# Patient Record
Sex: Male | Born: 2000 | Race: White | Hispanic: No | Marital: Single | Smoking: Never smoker
Health system: Southern US, Community
[De-identification: ages and names within clinical notes are randomized; demographics above are authoritative.]

## PROBLEM LIST (undated history)

## (undated) DIAGNOSIS — S060X9A Concussion with loss of consciousness of unspecified duration, initial encounter: Secondary | ICD-10-CM

## (undated) DIAGNOSIS — S060XAA Concussion with loss of consciousness status unknown, initial encounter: Secondary | ICD-10-CM

## (undated) HISTORY — PX: WISDOM TOOTH EXTRACTION: SHX21

---

## 2019-07-08 ENCOUNTER — Other Ambulatory Visit: Payer: Self-pay

## 2019-07-08 DIAGNOSIS — Z20822 Contact with and (suspected) exposure to covid-19: Secondary | ICD-10-CM

## 2019-07-10 LAB — NOVEL CORONAVIRUS, NAA: SARS-CoV-2, NAA: NOT DETECTED

## 2019-08-03 ENCOUNTER — Other Ambulatory Visit: Payer: Self-pay

## 2019-08-03 DIAGNOSIS — Z20822 Contact with and (suspected) exposure to covid-19: Secondary | ICD-10-CM

## 2019-08-05 LAB — NOVEL CORONAVIRUS, NAA: SARS-CoV-2, NAA: NOT DETECTED

## 2020-09-16 ENCOUNTER — Encounter: Payer: Self-pay | Admitting: Emergency Medicine

## 2020-09-16 ENCOUNTER — Emergency Department: Payer: Commercial Managed Care - PPO

## 2020-09-16 ENCOUNTER — Emergency Department
Admission: EM | Admit: 2020-09-16 | Discharge: 2020-09-16 | Disposition: A | Discharge status: Home / Self Care | Payer: Commercial Managed Care - PPO | Attending: Emergency Medicine | Admitting: Emergency Medicine

## 2020-09-16 ENCOUNTER — Other Ambulatory Visit: Payer: Self-pay

## 2020-09-16 DIAGNOSIS — S0993XA Unspecified injury of face, initial encounter: Secondary | ICD-10-CM | POA: Diagnosis present

## 2020-09-16 DIAGNOSIS — W01198A Fall on same level from slipping, tripping and stumbling with subsequent striking against other object, initial encounter: Secondary | ICD-10-CM | POA: Diagnosis not present

## 2020-09-16 DIAGNOSIS — S0181XA Laceration without foreign body of other part of head, initial encounter: Secondary | ICD-10-CM

## 2020-09-16 DIAGNOSIS — S0232XA Fracture of orbital floor, left side, initial encounter for closed fracture: Secondary | ICD-10-CM | POA: Diagnosis not present

## 2020-09-16 DIAGNOSIS — Z23 Encounter for immunization: Secondary | ICD-10-CM | POA: Diagnosis not present

## 2020-09-16 DIAGNOSIS — S0285XA Fracture of orbit, unspecified, initial encounter for closed fracture: Secondary | ICD-10-CM

## 2020-09-16 HISTORY — DX: Concussion with loss of consciousness of unspecified duration, initial encounter: S06.0X9A

## 2020-09-16 HISTORY — DX: Concussion with loss of consciousness status unknown, initial encounter: S06.0XAA

## 2020-09-16 MED ORDER — TETANUS-DIPHTH-ACELL PERTUSSIS 5-2.5-18.5 LF-MCG/0.5 IM SUSY
0.5000 mL | PREFILLED_SYRINGE | Freq: Once | INTRAMUSCULAR | Status: AC
  Administered 2020-09-16: 0.5 mL via INTRAMUSCULAR
  Filled 2020-09-16: qty 0.5

## 2020-09-16 MED ORDER — LIDOCAINE-EPINEPHRINE 2 %-1:100000 IJ SOLN
20.0000 mL | Freq: Once | INTRAMUSCULAR | Status: AC
  Administered 2020-09-16: 20 mL via INTRADERMAL
  Filled 2020-09-16: qty 1

## 2020-09-16 MED ORDER — BACITRACIN ZINC 500 UNIT/GM EX OINT
TOPICAL_OINTMENT | Freq: Two times a day (BID) | CUTANEOUS | Status: DC
  Filled 2020-09-16: qty 0.9

## 2020-09-16 MED ORDER — IBUPROFEN 600 MG PO TABS: 600.0000 mg | ORAL_TABLET | Freq: Three times a day (TID) | ORAL | 0 refills | Status: DC | PRN

## 2020-09-16 MED ORDER — TETRACAINE HCL 0.5 % OP SOLN
2.0000 [drp] | Freq: Once | OPHTHALMIC | Status: AC
  Administered 2020-09-16: 2 [drp] via OPHTHALMIC
  Filled 2020-09-16: qty 4

## 2020-09-16 MED ORDER — BACITRACIN ZINC 500 UNIT/GM EX OINT: TOPICAL_OINTMENT | Freq: Two times a day (BID) | CUTANEOUS | Status: DC

## 2020-09-16 MED ORDER — CEPHALEXIN 500 MG PO CAPS: 500.0000 mg | ORAL_CAPSULE | Freq: Three times a day (TID) | ORAL | 0 refills | Status: AC

## 2020-09-16 NOTE — ED Provider Notes (Signed)
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 extraocular movements without clinical evidence of entrapment.  Will have him follow-up with ophthalmologist but no apparent emergent indication indicated.  No nose blowing.  Soft diet encouraged.  ____________________________________________  FINAL CLINICAL IMPRESSION(S) / ED DIAGNOSES  Final diagnoses:  Facial laceration, initial encounter  Closed fracture of orbit, initial encounter (HCC)     MEDICATIONS GIVEN DURING THIS VISIT:  Medications  bacitracin ointment (has no administration in time range)  Tdap (BOOSTRIX) injection 0.5 mL (0.5 mLs Intramuscular Given 09/16/20 1859)  tetracaine (PONTOCAINE) 0.5 % ophthalmic solution 2 drop (2 drops Left Eye Given by Other 09/16/20 1832)  lidocaine-EPINEPHrine (XYLOCAINE W/EPI) 2 %-1:100000 (with pres) injection 20 mL (20 mLs Intradermal Given by Other 09/16/20 1832)     ED Discharge Orders         Ordered    cephALEXin (KEFLEX) 500 MG capsule  3 times daily        09/16/20 1850    ibuprofen (ADVIL) 600 MG tablet  Every 8 hours PRN        09/16/20 1850           Note:  This document was prepared using Dragon voice recognition software and may include  unintentional dictation errors.   Shaune Pollack, MD 09/16/20 [phone]

## 2020-09-16 NOTE — Discharge Instructions (Addendum)
Cover the wound in antibiotic ointment (over-the-counter is fine) twice to three times a day until healed The sutures should dissolve on their own  For your fracture: NO NOSE BLOWING or direct pressure to your eye Apply ice intermittently for the next 24 hours No swimming or submerging your head underwater Follow-up with an eye doctor (opto or ophthalmologist) or ENT physician in the next week  Try eating a soft diet for 1-2 days
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
## 2020-11-27 ENCOUNTER — Other Ambulatory Visit: Payer: Self-pay

## 2020-11-27 ENCOUNTER — Emergency Department: Payer: Commercial Managed Care - PPO

## 2020-11-27 ENCOUNTER — Emergency Department
Admission: EM | Admit: 2020-11-27 | Discharge: 2020-11-27 | Disposition: A | Discharge status: Home / Self Care | Payer: Commercial Managed Care - PPO | Attending: Emergency Medicine | Admitting: Emergency Medicine

## 2020-11-27 DIAGNOSIS — W01198A Fall on same level from slipping, tripping and stumbling with subsequent striking against other object, initial encounter: Secondary | ICD-10-CM | POA: Diagnosis not present

## 2020-11-27 DIAGNOSIS — F10220 Alcohol dependence with intoxication, uncomplicated: Secondary | ICD-10-CM | POA: Insufficient documentation

## 2020-11-27 DIAGNOSIS — S0990XA Unspecified injury of head, initial encounter: Secondary | ICD-10-CM

## 2020-11-27 DIAGNOSIS — Y92511 Restaurant or cafe as the place of occurrence of the external cause: Secondary | ICD-10-CM | POA: Diagnosis not present

## 2020-11-27 DIAGNOSIS — F10920 Alcohol use, unspecified with intoxication, uncomplicated: Secondary | ICD-10-CM

## 2020-11-27 DIAGNOSIS — S0101XA Laceration without foreign body of scalp, initial encounter: Secondary | ICD-10-CM | POA: Diagnosis not present

## 2020-11-27 DIAGNOSIS — Y908 Blood alcohol level of 240 mg/100 ml or more: Secondary | ICD-10-CM | POA: Diagnosis not present

## 2020-11-27 LAB — CBC WITH DIFFERENTIAL/PLATELET
Abs Immature Granulocytes: 0.02 10*3/uL (ref 0.00–0.07)
Basophils Absolute: 0.1 10*3/uL (ref 0.0–0.1)
Basophils Relative: 1 %
Eosinophils Absolute: 0.2 10*3/uL (ref 0.0–0.5)
Eosinophils Relative: 3 %
HCT: 43.5 % (ref 39.0–52.0)
Hemoglobin: 15 g/dL (ref 13.0–17.0)
Immature Granulocytes: 0 %
Lymphocytes Relative: 34 %
Lymphs Abs: 2.3 10*3/uL (ref 0.7–4.0)
MCH: 30.9 pg (ref 26.0–34.0)
MCHC: 34.5 g/dL (ref 30.0–36.0)
MCV: 89.7 fL (ref 80.0–100.0)
Monocytes Absolute: 0.4 10*3/uL (ref 0.1–1.0)
Monocytes Relative: 5 %
Neutro Abs: 4 10*3/uL (ref 1.7–7.7)
Neutrophils Relative %: 57 %
Platelets: 231 10*3/uL (ref 150–400)
RBC: 4.85 MIL/uL (ref 4.22–5.81)
RDW: 11.9 % (ref 11.5–15.5)
WBC: 6.9 10*3/uL (ref 4.0–10.5)
nRBC: 0 % (ref 0.0–0.2)

## 2020-11-27 LAB — COMPREHENSIVE METABOLIC PANEL
ALT: 21 U/L (ref 0–44)
AST: 36 U/L (ref 15–41)
Albumin: 4.8 g/dL (ref 3.5–5.0)
Alkaline Phosphatase: 56 U/L (ref 38–126)
Anion gap: 12 (ref 5–15)
BUN: 12 mg/dL (ref 6–20)
CO2: 24 mmol/L (ref 22–32)
Calcium: 8.8 mg/dL — ABNORMAL LOW (ref 8.9–10.3)
Chloride: 108 mmol/L (ref 98–111)
Creatinine, Ser: 1.22 mg/dL (ref 0.61–1.24)
GFR, Estimated: >60 mL/min (ref >60)
Glucose, Bld: 103 mg/dL — ABNORMAL HIGH (ref 70–99)
Potassium: 3.8 mmol/L (ref 3.5–5.1)
Sodium: 144 mmol/L (ref 135–145)
Total Bilirubin: 0.5 mg/dL (ref 0.3–1.2)
Total Protein: 7.7 g/dL (ref 6.5–8.1)

## 2020-11-27 LAB — ETHANOL: Alcohol, Ethyl (B): 312 mg/dL (ref <10)

## 2020-11-27 MED ORDER — SODIUM CHLORIDE 0.9 % IV BOLUS
1000.0000 mL | Freq: Once | INTRAVENOUS | Status: AC
  Administered 2020-11-27: 1000 mL via INTRAVENOUS

## 2020-11-27 NOTE — ED Notes (Signed)
Date and time results received: 11/27/20 0247 (use smartphrase ".now" to insert current time)  Test: ETOH Critical Value: 355  Name of Provider Notified: Dolores Frame, MD
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
## 2020-11-27 NOTE — ED Provider Notes (Incomplete)
[facility] Emergency Department Provider Note   ____________________________________________   Event Date/Time   First MD Initiated Contact with Patient 11/27/20 0130     (approximate)  I have reviewed the triage vital signs and the nursing notes.   HISTORY  Chief Complaint Fall and Head Injury    HPI Ethan Hardy is a 20 y.o. male ***        {**SYMPTOM/COMPLAINT  LOCATION (describe anatomically) DURATION (when did it start) TIMING (onset and pattern) SEVERITY (0-10, mild/moderate/severe) QUALITY (description of symptoms) CONTEXT (recent surgery, new meds, activity, etc.) MODIFYINGFACTORS (what makes it better/worse) ASSOCIATEDSYMPTOMS (pertinent positives and negatives)**} Past Medical History:  Diagnosis Date  . Concussion     There are no problems to display for this patient.   Past Surgical History:  Procedure Laterality Date  . WISDOM TOOTH EXTRACTION      Prior to Admission medications   Medication Sig Start Date End Date Taking? Authorizing Provider  ibuprofen (ADVIL) 600 MG tablet Take 1 tablet (600 mg total) by mouth every 8 (eight) hours as needed for moderate pain. 09/16/20   Shaune Pollack, MD    Allergies Patient has no known allergies.  No family history on file.  Social History Social History   Tobacco Use  . Smoking status: Never Smoker  . Smokeless tobacco: Never Used  Substance Use Topics  . Alcohol use: Yes  . Drug use: Yes    Types: Marijuana    Review of Systems {** Revise as appropriate then delete this line - Documentation of 10 systems is required  **} Constitutional: No fever/chills Eyes: No visual changes. ENT: No sore throat. Cardiovascular: Denies chest pain. Respiratory: Denies shortness of breath. Gastrointestinal: No abdominal pain.  No nausea, no vomiting.  No diarrhea.  No constipation. Genitourinary: Negative for dysuria. Musculoskeletal: Negative for back pain. Skin:  Negative for rash. Neurological: Negative for headaches, focal weakness or numbness. {**Psychiatric:  Endocrine:  Hematological/Lymphatic:  Allergic/Immunilogical: **}  ____________________________________________   PHYSICAL EXAM:  VITAL SIGNS: ED Triage Vitals  Enc Vitals Group     BP      Pulse      Resp      Temp      Temp src      SpO2      Weight      Height      Head Circumference      Peak Flow      Pain Score      Pain Loc      Pain Edu?      Excl. in GC?    {** Revise as appropriate then delete this line - 8 systems required **} Constitutional: Alert and oriented. Well appearing and in no acute distress. Eyes: Conjunctivae are normal. PERRL. EOMI. Head: Atraumatic. Nose: No congestion/rhinnorhea. Mouth/Throat: Mucous membranes are moist.  Oropharynx non-erythematous. Neck: No stridor.  {**No cervical spine tenderness to palpation.**} {**Hematological/Lymphatic/Immunilogical: No cervical lymphadenopathy. **}Cardiovascular: Normal rate, regular rhythm. Grossly normal heart sounds.  Good peripheral circulation. Respiratory: Normal respiratory effort.  No retractions. Lungs CTAB. Gastrointestinal: Soft and nontender. No distention. No abdominal bruits. No CVA tenderness. {**Genitourinary:  **}Musculoskeletal: No lower extremity tenderness nor edema.  No joint effusions. Neurologic:  Normal speech and language. No gross focal neurologic deficits are appreciated. No gait instability. Skin:  Skin is warm, dry and intact. No rash noted. Psychiatric: Mood and affect are normal. Speech and behavior are normal.  ____________________________________________   LABS (all labs ordered are  listed, but only abnormal results are displayed)  Labs Reviewed  CBC WITH DIFFERENTIAL/PLATELET  COMPREHENSIVE METABOLIC PANEL  ETHANOL   ____________________________________________  EKG  *** ____________________________________________  RADIOLOGY I, Dijuan Sleeth J,  personally viewed and evaluated these images (plain radiographs) as part of my medical decision making, as well as reviewing the written report by the radiologist.  ED MD interpretation:  ***  Official radiology report(s): No results found.  ____________________________________________   PROCEDURES  Procedure(s) performed (including Critical Care):  Procedures   ____________________________________________   INITIAL IMPRESSION / ASSESSMENT AND PLAN / ED COURSE  As part of my medical decision making, I reviewed the following data within the electronic MEDICAL RECORD NUMBER {[id number]::"Notes from prior ED visits","[place] Controlled Substance Database"}        ***      ____________________________________________   FINAL CLINICAL IMPRESSION(S) / ED DIAGNOSES  Final diagnoses:  None     ED Discharge Orders    None      *Please note:  [name] was evaluated in Emergency Department on 11/27/2020 for the symptoms described in the history of present illness. He was evaluated in the context of the global COVID-19 pandemic, which necessitated consideration that the patient might be at risk for infection with the SARS-CoV-2 virus that causes COVID-19. Institutional protocols and algorithms that pertain to the evaluation of patients at risk for COVID-19 are in a state of rapid change based on information released by regulatory bodies including the CDC and federal and state organizations. These policies and algorithms were followed during the patient's care in the ED.  Some ED evaluations and interventions may be delayed as a result of limited staffing during and the pandemic.*   Note:  This document was prepared using Dragon voice recognition software and may include unintentional dictation errors.

## 2020-11-27 NOTE — Discharge Instructions (Addendum)
Staple removal in 7 to 10 days.  You may take Tylenol as needed for pain.  Return to the ER for worsening symptoms, persistent vomiting, lethargy or other concerns.
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
## 2021-07-30 IMAGING — CT CT HEAD W/O CM
2 of 3 series · 12 of 46 positions shown, 15 images · non-contrast
Comparison: None.

CLINICAL DATA: Facial injury, does not recall, previously
intoxicated, woke with abrasions and swelling to the face, contact
lens possibly within the left eye

EXAM:
CT HEAD WITHOUT CONTRAST
CT MAXILLOFACIAL WITHOUT CONTRAST
CT CERVICAL SPINE WITHOUT CONTRAST
TECHNIQUE: Multidetector CT imaging of the head, cervical spine, and
maxillofacial structures were performed using the standard protocol
without intravenous contrast. Multiplanar CT image reconstructions
of the cervical spine and maxillofacial structures were also
generated.

[Series 2: head wo · axial · 0.42mm/px · z∈[-168,-48]mm · 9 of 29 slices shown, 12 images]
[im 3/29  brain]
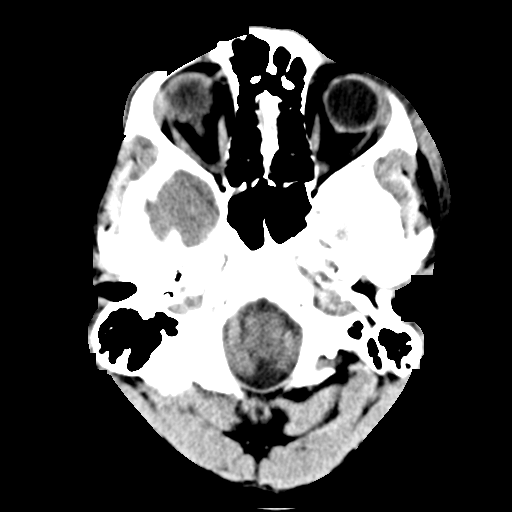
[im 3/29  bone]
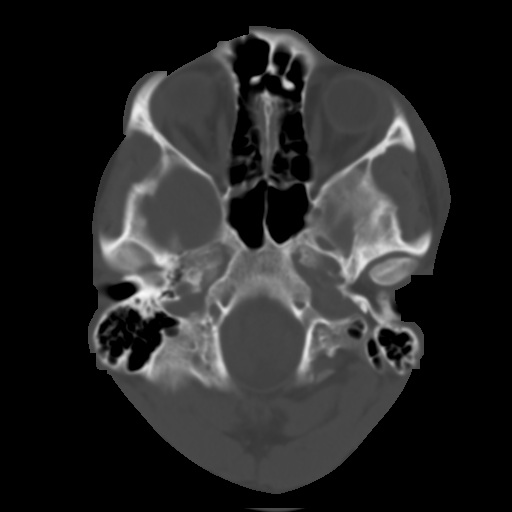
[im 6/29  brain]
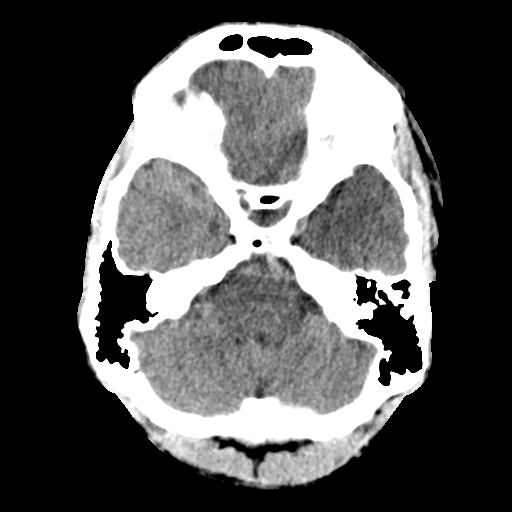
[im 9/29  brain]
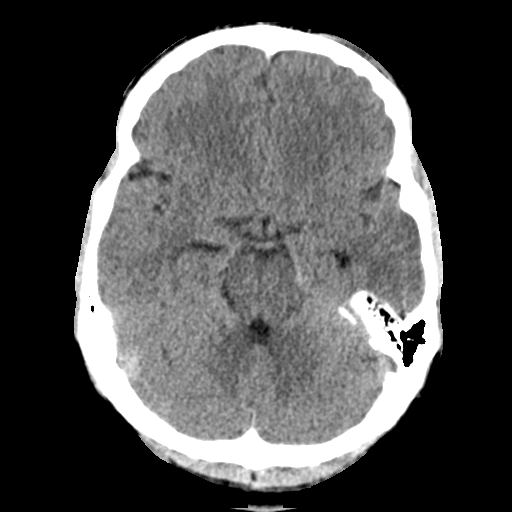
[im 12/29  brain]
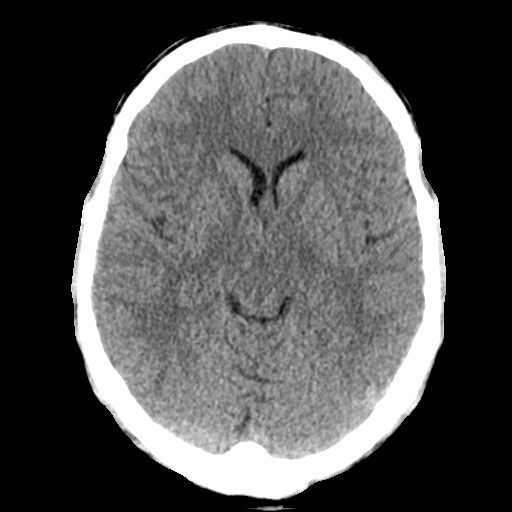
[im 15/29  brain]
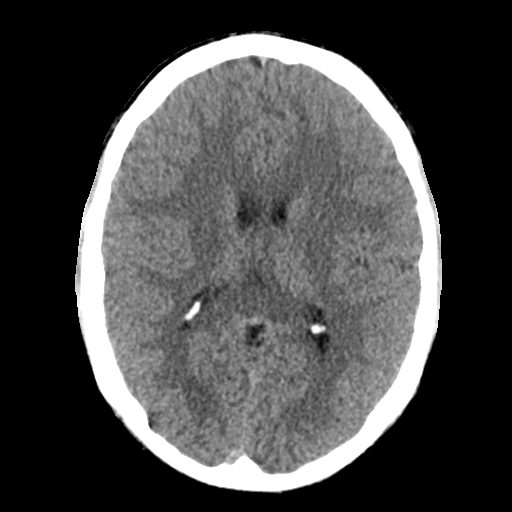
[im 15/29  bone]
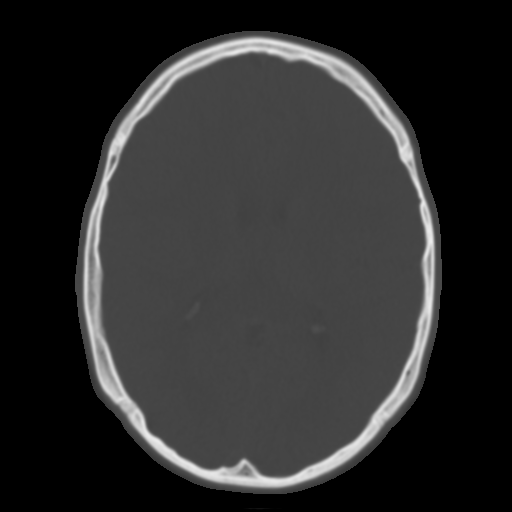
[im 18/29  brain]
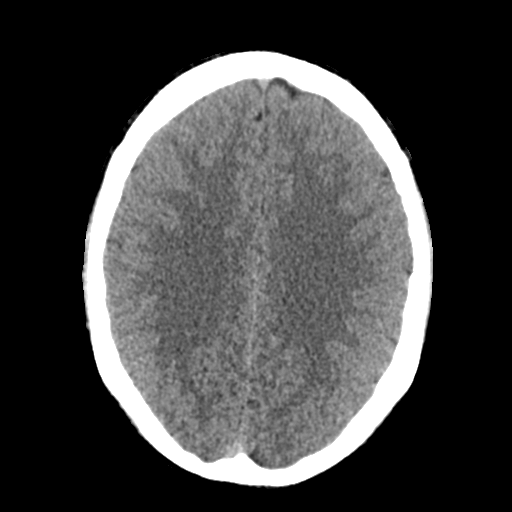
[im 21/29  brain]
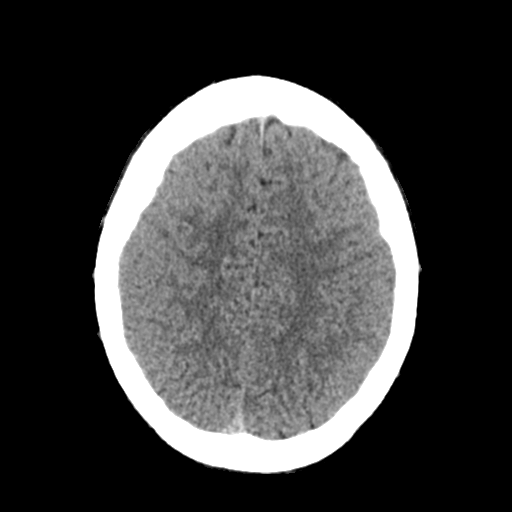
[im 24/29  brain]
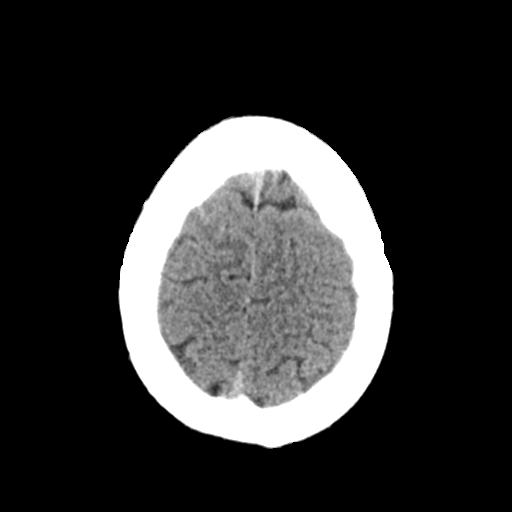
[im 27/29  brain]
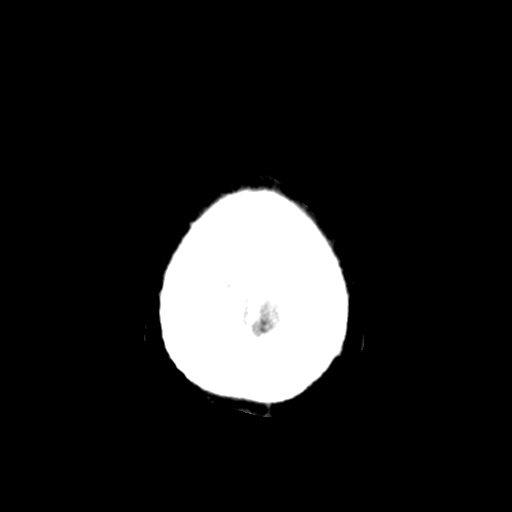
[im 27/29  bone]
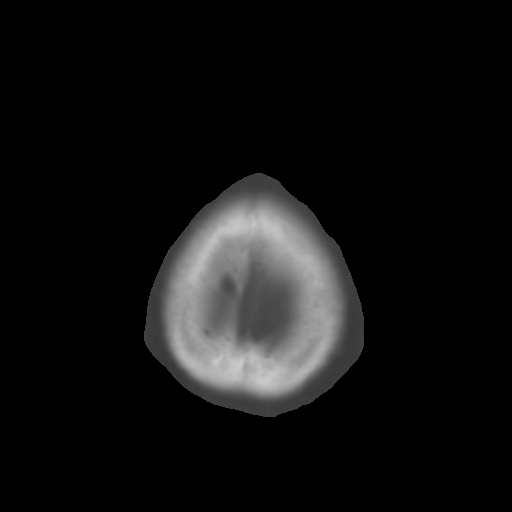

[Series 4: coronal soft tissue · coronal · 0.31mm/px · 3 of 71 slices shown]
[im 24/71  brain]
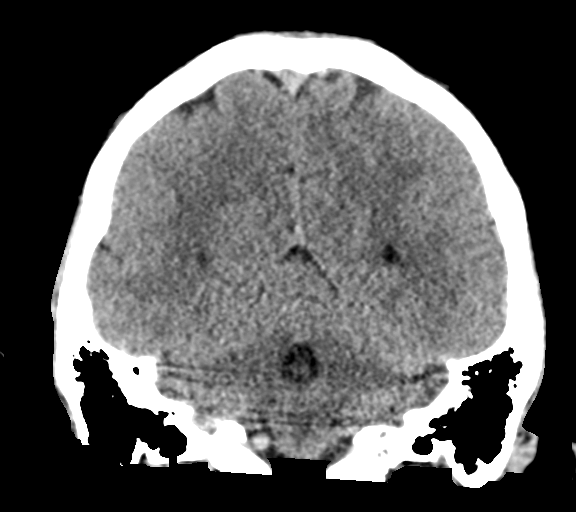
[im 32/71  brain]
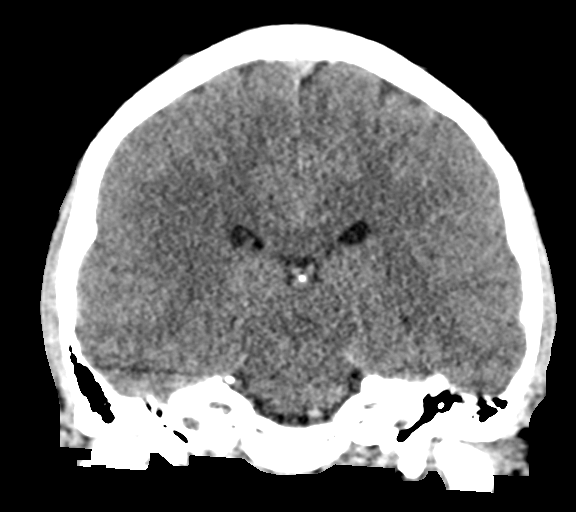
[im 39/71  brain]
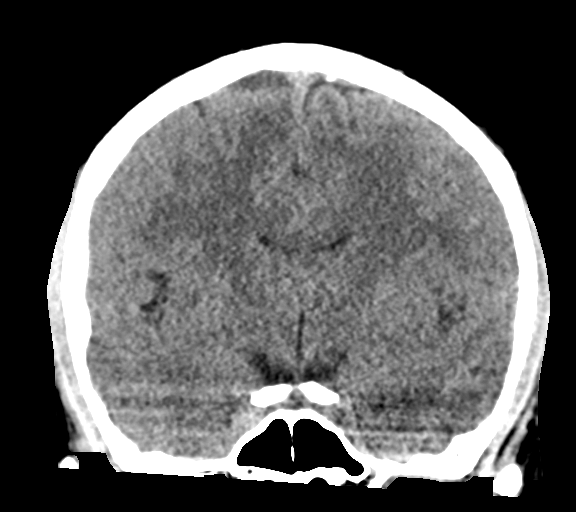

[12 of 46 positions shown; findings below may reference images not displayed]

FINDINGS: CT HEAD FINDINGS

Brain: No evidence of acute infarction, hemorrhage, hydrocephalus,
extra-axial collection, visible mass lesion or mass effect. Basal
cisterns are patent. Midline intracranial structures are
unremarkable. Cerebellar tonsils are normally positioned.

Vascular: No hyperdense vessel or unexpected calcification.

Skull: Inferior left frontal and supraorbital scalp swelling, better
detailed below. No other significant scalp swelling or hematoma.
Benign-appearing scalp calcification the high frontal midline scalp.

Other: None.

CT MAXILLOFACIAL FINDINGS

Osseous: There is a subtle lucency extending through the floor of
the orbit extending into the left infraorbital foramen with the
infraorbital nerve partially coursing through a small bony septation
in the left maxillary sinus, variant anatomy. No other visible
fracture of the bony orbit. Nasal bones are intact. No other mid
face fractures are seen. The pterygoid plates are intact. No visible
or suspected temporal bone fractures. Temporomandibular joints are
normally aligned. The mandible is intact. No fractured or avulsed
teeth.

Orbits: Asymmetric swelling of the left frontal scalp, periorbital
soft tissues and malar tissues with associated hematoma measuring to
1.4 cm in maximal thickness over the zygomatic arch as well as
asymmetric left palpebral swelling and edematous thickening with
some sub palpebral gas. Minimal stranding adjacent the left orbital
floor near the presumed orbital floor fracture. No other retro
septal gas, stranding or hemorrhage is seen. The globes appear
normal and symmetric. Symmetric appearance of the extraocular
musculature and optic nerve sheath complexes. Normal caliber of the
superior ophthalmic veins.

Sinuses: The left infraorbital nerve partially traverses the left
maxillary sinus within a small bony septum. This is better described
above. Minimal thickening in the left maxillary sinus including
adjacent the presumed orbital floor fracture. No clear protrusion of
the inferior rectus. No layering air-fluid levels or pneumatized
secretions in the left maxillary sinus or elsewhere in the paranasal
sinuses. Mastoid air cells are clear. Middle ear cavities are clear.
Ossicular chains are normally configured.

Soft tissues: Extensive left frontal, periorbital and malar soft
tissue swelling. Swelling extends into the bridge of the nose and
inferiorly into the soft tissues of the upper lip. Additional mild
pre mental soft tissue swelling is present, right slightly more
pronounced than left. No soft tissue gas or foreign body is seen.

CT CERVICAL SPINE FINDINGS

Alignment: Stabilization collar is absent at the time of
examination. There is slight cervical flexion on the scout view
which may account for a reversal the normal cervical lordosis seen
on the sagittal reconstructions. Slight leftward cranial rotation is
noted as well. No evidence of traumatic listhesis. No abnormally
widened, perched or jumped facets. Normal alignment of the
craniocervical and atlantoaxial articulations accounting for
positioning and cranial rotation.

Skull base and vertebrae: No acute skull base fracture. No vertebral
body fracture or height loss. Normal bone mineralization. No
worrisome osseous lesions.

Soft tissues and spinal canal: No pre or paravertebral fluid or
swelling. No visible canal hematoma.

Disc levels: No significant central canal or foraminal stenosis
identified within the imaged levels of the spine.

Upper chest: No acute abnormality in the upper chest or imaged lung
apices.

Other: Normal thyroid.
IMPRESSION: 1. No acute intracranial abnormality.
2. Extensive left frontal scalp, periorbital and malar soft tissue
swelling. Swelling extends into the bridge of the nose and
inferiorly into the soft tissues of the upper lip. Associated soft
tissue hematoma measuring up to 1.4 mm in maximal thickness over the
zygomatic arch. Mild pre mental soft tissue thickening may be
present as well.
3. Subtle lucency extending through the floor of the left orbit
extending into the left infraorbital foramen could reflect a
nondisplaced or trapdoor orbital floor fracture. Minimal stranding
in of the extraconal fat within the left orbit adjacent this
presumed fracture line and a small amount of thickening adjacent
fracture line within the maxillary sinus. Correlate with close
ocular examination to exclude ocular entrapment.
4. No globe injury is identified. Small amount of sub palpebral
lucency may reflect gas beneath the eyelid in the setting of
swelling. Contact lenses may be radiolucent and therefore difficult
or impossible to visualize on CT imaging. Correlate with direct
visual inspection.
5. Left infraorbital nerve partially coursing through a small bony
septum in the left maxillary sinus, variant anatomy.
6. No acute fracture or traumatic listhesis of the cervical spine.

These results were called by telephone at the time of interpretation
on 09/16/2020 at [DATE] to provider FARIDIAH MAGALINE , who verbally
acknowledged these results.
